# Patient Record
Sex: Male | Born: 1935 | Race: White | Hispanic: No | Marital: Married | Smoking: Never smoker
Health system: Southern US, Community
[De-identification: ages and names within clinical notes are randomized; demographics above are authoritative.]

## PROBLEM LIST (undated history)

## (undated) DIAGNOSIS — E7800 Pure hypercholesterolemia, unspecified: Secondary | ICD-10-CM

## (undated) DIAGNOSIS — F0390 Unspecified dementia without behavioral disturbance: Secondary | ICD-10-CM

## (undated) DIAGNOSIS — I2510 Atherosclerotic heart disease of native coronary artery without angina pectoris: Secondary | ICD-10-CM

## (undated) DIAGNOSIS — E119 Type 2 diabetes mellitus without complications: Secondary | ICD-10-CM

---

## 2016-08-11 ENCOUNTER — Emergency Department (HOSPITAL_BASED_OUTPATIENT_CLINIC_OR_DEPARTMENT_OTHER)
Admission: EM | Admit: 2016-08-11 | Discharge: 2016-08-11 | Disposition: A | Discharge status: Home / Self Care | Payer: Medicare Other | Attending: Emergency Medicine | Admitting: Emergency Medicine

## 2016-08-11 ENCOUNTER — Emergency Department (HOSPITAL_BASED_OUTPATIENT_CLINIC_OR_DEPARTMENT_OTHER): Payer: Medicare Other

## 2016-08-11 ENCOUNTER — Encounter (HOSPITAL_BASED_OUTPATIENT_CLINIC_OR_DEPARTMENT_OTHER): Payer: Self-pay | Admitting: Emergency Medicine

## 2016-08-11 DIAGNOSIS — Y9384 Activity, sleeping: Secondary | ICD-10-CM | POA: Insufficient documentation

## 2016-08-11 DIAGNOSIS — E119 Type 2 diabetes mellitus without complications: Secondary | ICD-10-CM | POA: Insufficient documentation

## 2016-08-11 DIAGNOSIS — I2510 Atherosclerotic heart disease of native coronary artery without angina pectoris: Secondary | ICD-10-CM | POA: Insufficient documentation

## 2016-08-11 DIAGNOSIS — S0990XA Unspecified injury of head, initial encounter: Secondary | ICD-10-CM | POA: Diagnosis not present

## 2016-08-11 DIAGNOSIS — M79642 Pain in left hand: Secondary | ICD-10-CM | POA: Insufficient documentation

## 2016-08-11 DIAGNOSIS — W19XXXA Unspecified fall, initial encounter: Secondary | ICD-10-CM

## 2016-08-11 DIAGNOSIS — S6991XA Unspecified injury of right wrist, hand and finger(s), initial encounter: Secondary | ICD-10-CM | POA: Diagnosis present

## 2016-08-11 DIAGNOSIS — R1084 Generalized abdominal pain: Secondary | ICD-10-CM | POA: Insufficient documentation

## 2016-08-11 DIAGNOSIS — W06XXXA Fall from bed, initial encounter: Secondary | ICD-10-CM | POA: Insufficient documentation

## 2016-08-11 DIAGNOSIS — Y999 Unspecified external cause status: Secondary | ICD-10-CM | POA: Insufficient documentation

## 2016-08-11 DIAGNOSIS — Y92122 Bedroom in nursing home as the place of occurrence of the external cause: Secondary | ICD-10-CM | POA: Diagnosis not present

## 2016-08-11 DIAGNOSIS — S161XXA Strain of muscle, fascia and tendon at neck level, initial encounter: Secondary | ICD-10-CM | POA: Diagnosis not present

## 2016-08-11 DIAGNOSIS — R109 Unspecified abdominal pain: Secondary | ICD-10-CM | POA: Diagnosis not present

## 2016-08-11 HISTORY — DX: Type 2 diabetes mellitus without complications: E11.9

## 2016-08-11 HISTORY — DX: Atherosclerotic heart disease of native coronary artery without angina pectoris: I25.10

## 2016-08-11 HISTORY — DX: Pure hypercholesterolemia, unspecified: E78.00

## 2016-08-11 HISTORY — DX: Unspecified dementia, unspecified severity, without behavioral disturbance, psychotic disturbance, mood disturbance, and anxiety: F03.90

## 2016-08-11 LAB — COMPREHENSIVE METABOLIC PANEL
ALT: 11 U/L — AB (ref 17–63)
AST: 19 U/L (ref 15–41)
Albumin: 3.4 g/dL — ABNORMAL LOW (ref 3.5–5.0)
Alkaline Phosphatase: 70 U/L (ref 38–126)
Anion gap: 7 (ref 5–15)
BUN: 22 mg/dL — ABNORMAL HIGH (ref 6–20)
CHLORIDE: 105 mmol/L (ref 101–111)
CO2: 26 mmol/L (ref 22–32)
CREATININE: 1.03 mg/dL (ref 0.61–1.24)
Calcium: 8.7 mg/dL — ABNORMAL LOW (ref 8.9–10.3)
Glucose, Bld: 108 mg/dL — ABNORMAL HIGH (ref 65–99)
POTASSIUM: 4 mmol/L (ref 3.5–5.1)
Sodium: 138 mmol/L (ref 135–145)
TOTAL PROTEIN: 6.2 g/dL — AB (ref 6.5–8.1)
Total Bilirubin: 0.6 mg/dL (ref 0.3–1.2)

## 2016-08-11 LAB — CBC WITH DIFFERENTIAL/PLATELET
BASOS ABS: 0 10*3/uL (ref 0.0–0.1)
BASOS PCT: 0 %
Eosinophils Absolute: 0.2 10*3/uL (ref 0.0–0.7)
Eosinophils Relative: 4 %
HCT: 33.8 % — ABNORMAL LOW (ref 39.0–52.0)
Hemoglobin: 11.1 g/dL — ABNORMAL LOW (ref 13.0–17.0)
LYMPHS PCT: 27 %
Lymphs Abs: 0.9 10*3/uL (ref 0.7–4.0)
MCH: 32.4 pg (ref 26.0–34.0)
MCHC: 32.8 g/dL (ref 30.0–36.0)
MCV: 98.5 fL (ref 78.0–100.0)
Monocytes Absolute: 0.5 10*3/uL (ref 0.1–1.0)
Monocytes Relative: 14 %
Neutro Abs: 1.9 10*3/uL (ref 1.7–7.7)
Neutrophils Relative %: 55 %
PLATELETS: 215 10*3/uL (ref 150–400)
RBC: 3.43 MIL/uL — AB (ref 4.22–5.81)
RDW: 13.5 % (ref 11.5–15.5)
WBC: 3.4 10*3/uL — AB (ref 4.0–10.5)

## 2016-08-11 LAB — LIPASE, BLOOD: LIPASE: 41 U/L (ref 11–51)

## 2016-08-11 LAB — URINALYSIS, ROUTINE W REFLEX MICROSCOPIC
Glucose, UA: 100 mg/dL — AB
Hgb urine dipstick: NEGATIVE
Ketones, ur: 15 mg/dL — AB
LEUKOCYTES UA: NEGATIVE
NITRITE: NEGATIVE
PROTEIN: NEGATIVE mg/dL
Specific Gravity, Urine: 1.025 (ref 1.005–1.030)
pH: 6 (ref 5.0–8.0)

## 2016-08-11 LAB — TROPONIN I

## 2016-08-11 MED ORDER — SODIUM CHLORIDE 0.9 % IV BOLUS (SEPSIS)
1000.0000 mL | Freq: Once | INTRAVENOUS | Status: AC
  Administered 2016-08-11: 1000 mL via INTRAVENOUS

## 2016-08-11 NOTE — ED Triage Notes (Signed)
Pt lives at [facility]. Staff notified son that he has fallen twice and was noted to have orthostatic hypotension. Pt c/o L little finger pain from the fall, bandage in place with some bleeding noted. Pt states he doesn't know why he is falling. Pt denies neck or back pain.

## 2016-08-11 NOTE — Discharge Instructions (Signed)
You were seen in the ED today after a fall. There is a small cut to the finger which you should keep wrapped and allow to heal on its own. You were found to have a significant amount of stool on x-ray and may need to take Miralax or other stool softener to promote good bowel movements. Follow up with your PCP this week.   Return to the ED with any new or worsening pain or additional falls.

## 2016-08-11 NOTE — ED Notes (Signed)
MD Long at bedside discussing results with patient and family.

## 2016-08-11 NOTE — ED Notes (Signed)
Pt remains in radiology at this time.

## 2016-08-11 NOTE — ED Notes (Signed)
Pt in radiology 

## 2016-08-11 NOTE — ED Provider Notes (Signed)
Emergency Department Provider Note  By signing my name below, I, Deland Pretty, attest that this documentation has been prepared under the direction and in the presence of Juquan Reznick, Arlyss Repress, MD. Electronically Signed: Deland Pretty, ED Scribe. 08/11/16. 3:58 PM.  I have reviewed the triage vital signs and the nursing notes.   HISTORY  Chief Complaint Fall   HPI Comments: Terry Cunningham is a 82 y.o. male who presents to the Emergency Department complaining of mild right little finger pain s/p a mechanical fall that occurred today. The pt is also complaining of repeated falls that may be due to orthostatic hypotension, per son, and severe abdominal pain. Per son, the pt was found on the floor within [facility] and told the nurses that he fell out of his bed this morning. His son also indicates that the pt fell at home repeatedly and last week at approximately 1:30am this morning. The pt was recently prescribed Plavix, after being off of it for about a year. The pt ambulates with a walker. There is no associated chest pain or SOB.  Level 5 caveat: Dementia.   Past Medical History:  Diagnosis Date  . Coronary artery disease   . Dementia   . Diabetes mellitus without complication (HCC)   . High cholesterol     There are no active problems to display for this patient.   No past surgical history on file.  Current Outpatient Rx  . Order #: [id number] Class: Historical Med  . Order #: [id number] Class: Historical Med  . Order #: [id number] Class: Historical Med  . Order #: [id number] Class: Historical Med  . Order #: [id number] Class: Historical Med    Allergies Patient has no allergy information on record.  No family history on file.  Social History Social History  Substance Use Topics  . Smoking status: Never Smoker  . Smokeless tobacco: Never Used  . Alcohol use No    Review of Systems  Level 5 caveat: Dementia.    ____________________________________________   PHYSICAL EXAM:  VITAL SIGNS: ED Triage Vitals [08/11/16 1451]  Enc Vitals Group     BP 103/62     Pulse Rate 68     Resp      Temp 98.4 F (36.9 C)     Temp Source Oral     SpO2 98 %     Pain Score 0   Constitutional: Alert and oriented. Well appearing and in no acute distress. Eyes: Conjunctivae are normal.  Head: Atraumatic. Nose: No congestion/rhinnorhea. Mouth/Throat: Mucous membranes are moist.  Oropharynx non-erythematous. Neck: No stridor.   Cardiovascular: Normal rate, regular rhythm. Good peripheral circulation. Grossly normal heart sounds.   Respiratory: Normal respiratory effort.  No retractions. Lungs CTAB. Gastrointestinal: Soft with diffuse tenderness. No rebound or guarding. No distention.  Musculoskeletal: No lower extremity tenderness nor edema. No gross deformities of extremities. Full ROM of bilateral hips, knees, and ankles. Normal ROM of the shoulder, elbow, and wrists.  Neurologic:  Normal speech and language. No gross focal neurologic deficits are appreciated.  Skin:  Skin is warm, dry. No rash noted. Positive left little finger laceration 0.25 cm.   ____________________________________________   DIAGNOSTIC STUDIES: Oxygen Saturation is 98% on RA, normal by my interpretation.   COORDINATION OF CARE: 3:22 PM-Discussed next steps with pt. Pt verbalized understanding and is agreeable with the plan.   LABS (all labs ordered are listed, but only abnormal results are displayed)  Labs Reviewed  COMPREHENSIVE METABOLIC PANEL - Abnormal; Notable  for the following:       Result Value   Glucose, Bld 108 (*)    BUN 22 (*)    Calcium 8.7 (*)    Total Protein 6.2 (*)    Albumin 3.4 (*)    ALT 11 (*)    All other components within normal limits  CBC WITH DIFFERENTIAL/PLATELET - Abnormal; Notable for the following:    WBC 3.4 (*)    RBC 3.43 (*)    Hemoglobin 11.1 (*)    HCT 33.8 (*)    All other  components within normal limits  URINALYSIS, ROUTINE W REFLEX MICROSCOPIC - Abnormal; Notable for the following:    Glucose, UA 100 (*)    Bilirubin Urine SMALL (*)    Ketones, ur 15 (*)    All other components within normal limits  LIPASE, BLOOD  TROPONIN I   ____________________________________________  EKG   EKG Interpretation  Date/Time:  Sunday August 11 2016 15:02:24 EDT Ventricular Rate:  63 PR Interval:    QRS Duration: 89 QT Interval:  431 QTC Calculation: 442 R Axis:   46 Text Interpretation:  Sinus rhythm Short PR interval Low voltage, precordial leads Probable anteroseptal infarct, old Minimal ST elevation, inferior leads Baseline wander in lead(s) V6 No STEMI. No old for comparison.  Confirmed by Alona Bene [id number]) on 08/11/2016 3:07:18 PM       ____________________________________________  RADIOLOGY  Ct Head Wo Contrast  Result Date: 08/11/2016 CLINICAL DATA:  Status post fall EXAM: CT HEAD WITHOUT CONTRAST CT CERVICAL SPINE WITHOUT CONTRAST TECHNIQUE: Multidetector CT imaging of the head and cervical spine was performed following the standard protocol without intravenous contrast. Multiplanar CT image reconstructions of the cervical spine were also generated. COMPARISON:  None. FINDINGS: CT HEAD FINDINGS Brain: No evidence of acute infarction, hemorrhage, extra-axial collection or mass lesion/mass effect. Global cortical and central atrophy. Secondary ventricular prominence. Subcortical white matter and periventricular small vessel ischemic changes. Vascular: Mild intracranial atherosclerosis. Skull: Normal. Negative for fracture or focal lesion. Sinuses/Orbits: The visualized paranasal sinuses are essentially clear. The mastoid air cells are unopacified. Other: None. CT CERVICAL SPINE FINDINGS Alignment: Normal cervical lordosis. Skull base and vertebrae: No acute fracture. No primary bone lesion or focal pathologic process. Soft tissues and spinal canal: No  prevertebral fluid or swelling. No visible canal hematoma. Disc levels:  Mild degenerative changes of the lower cervical spine. Spinal canal is patent. Upper chest: Visualized lung apices are notable for mild biapical pleural-parenchymal scarring. Other: Visualized thyroid is unremarkable. IMPRESSION: No evidence of acute intracranial abnormality. Atrophy with secondary ventricular prominence. Small vessel ischemic changes. No evidence of traumatic injury to the cervical spine. Mild degenerative changes. Electronically Signed   By: Charline Bills M.D.   On: 08/11/2016 16:25   Ct Cervical Spine Wo Contrast  Result Date: 08/11/2016 CLINICAL DATA:  Status post fall EXAM: CT HEAD WITHOUT CONTRAST CT CERVICAL SPINE WITHOUT CONTRAST TECHNIQUE: Multidetector CT imaging of the head and cervical spine was performed following the standard protocol without intravenous contrast. Multiplanar CT image reconstructions of the cervical spine were also generated. COMPARISON:  None. FINDINGS: CT HEAD FINDINGS Brain: No evidence of acute infarction, hemorrhage, extra-axial collection or mass lesion/mass effect. Global cortical and central atrophy. Secondary ventricular prominence. Subcortical white matter and periventricular small vessel ischemic changes. Vascular: Mild intracranial atherosclerosis. Skull: Normal. Negative for fracture or focal lesion. Sinuses/Orbits: The visualized paranasal sinuses are essentially clear. The mastoid air cells are unopacified. Other: None. CT CERVICAL SPINE FINDINGS  Alignment: Normal cervical lordosis. Skull base and vertebrae: No acute fracture. No primary bone lesion or focal pathologic process. Soft tissues and spinal canal: No prevertebral fluid or swelling. No visible canal hematoma. Disc levels:  Mild degenerative changes of the lower cervical spine. Spinal canal is patent. Upper chest: Visualized lung apices are notable for mild biapical pleural-parenchymal scarring. Other: Visualized  thyroid is unremarkable. IMPRESSION: No evidence of acute intracranial abnormality. Atrophy with secondary ventricular prominence. Small vessel ischemic changes. No evidence of traumatic injury to the cervical spine. Mild degenerative changes. Electronically Signed   By: Charline BillsSriyesh  Krishnan M.D.   On: 08/11/2016 16:25   Dg Abdomen Acute W/chest  Result Date: 08/11/2016 CLINICAL DATA:  Severe abdominal pain. Multiple recent falls, possibly due to orthostatic hypotension. EXAM: DG ABDOMEN ACUTE W/ 1V CHEST COMPARISON:  None. FINDINGS: Normal sized heart. Tortuous aorta. Clear lungs with normal vascularity. Old, healed right rib fractures. No acute fracture or pneumothorax. Surgical clips at the gastroesophageal junction. Normal bowel gas pattern without free peritoneal air. Large amount of stool in the colon. Extensive thoracic spine degenerative changes with L1 and L2 compression deformities. IMPRESSION: 1. L1 and L2 vertebral compression deformities, age indeterminate. 2. Large amount of stool throughout the colon. 3. No acute abnormality. Electronically Signed   By: Beckie SaltsSteven  Reid M.D.   On: 08/11/2016 16:28   Dg Hand Complete Left  Result Date: 08/11/2016 CLINICAL DATA:  Mild left little finger pain following a fall today. EXAM: LEFT HAND - COMPLETE 3+ VIEW COMPARISON:  None. FINDINGS: No fracture or dislocation seen. Extensive first metacarpal/ carpal degenerative changes. Mild degenerative changes involving multiple interphalangeal joints. IMPRESSION: No fracture.  Degenerative changes. Electronically Signed   By: Beckie SaltsSteven  Reid M.D.   On: 08/11/2016 16:29    ____________________________________________   PROCEDURES  Procedure(s) performed:   Procedures  None ____________________________________________   INITIAL IMPRESSION / ASSESSMENT AND PLAN / ED COURSE  Pertinent labs & imaging results that were available during my care of the patient were reviewed by me and considered in my medical  decision making (see chart for details).  Patient presents to the emergency department for evaluation of 2 falls at his assisted living facility. One this morning. History is limited by dementia. Fall was unwitnessed. No evidence of head trauma. The patient does have a small laceration to the left fifth finger. Patient with full range of motion of the finger. No nail involvement. The wound is well approximated and hemostatic.   Labs, orthostatics, imaging all negative. Patient is ambulatory here in the ED. Discussed PCP follow up for increased PT at ALF vs transfer to memory care area.  At this time, I do not feel there is any life-threatening condition present. I have reviewed and discussed all results (EKG, imaging, lab, urine as appropriate), exam findings with patient. I have reviewed nursing notes and appropriate previous records.  I feel the patient is safe to be discharged home without further emergent workup. Discussed usual and customary return precautions. Patient and family (if present) verbalize understanding and are comfortable with this plan.  Patient will follow-up with their primary care provider. If they do not have a primary care provider, information for follow-up has been provided to them. All questions have been answered.  ____________________________________________  FINAL CLINICAL IMPRESSION(S) / ED DIAGNOSES  Final diagnoses:  Fall, initial encounter  Injury of head, initial encounter  Strain of neck muscle, initial encounter  Left hand pain  Generalized abdominal pain     MEDICATIONS  GIVEN DURING THIS VISIT:  Medications  sodium chloride 0.9 % bolus 1,000 mL (0 mLs Intravenous Stopped 08/11/16 1620)     NEW OUTPATIENT MEDICATIONS STARTED DURING THIS VISIT:  None   I personally performed the services described in this documentation, which was scribed in my presence. The recorded information has been reviewed and is accurate.    Note:  This document was  prepared using Dragon voice recognition software and may include unintentional dictation errors.  Alona Bene, MD Emergency Medicine    Shannan Garfinkel, Arlyss Repress, MD 08/12/16 [phone]

## 2016-08-11 NOTE — ED Notes (Signed)
Pt back in room. Given urinal to attempt to provide sample.

## 2016-09-29 ENCOUNTER — Emergency Department (HOSPITAL_BASED_OUTPATIENT_CLINIC_OR_DEPARTMENT_OTHER): Payer: Medicare Other

## 2016-09-29 ENCOUNTER — Emergency Department (HOSPITAL_BASED_OUTPATIENT_CLINIC_OR_DEPARTMENT_OTHER)
Admission: EM | Admit: 2016-09-29 | Discharge: 2016-09-30 | Disposition: A | Discharge status: Home / Self Care | Payer: Medicare Other | Attending: Emergency Medicine | Admitting: Emergency Medicine

## 2016-09-29 ENCOUNTER — Encounter (HOSPITAL_BASED_OUTPATIENT_CLINIC_OR_DEPARTMENT_OTHER): Payer: Self-pay | Admitting: Emergency Medicine

## 2016-09-29 DIAGNOSIS — R1084 Generalized abdominal pain: Secondary | ICD-10-CM | POA: Diagnosis present

## 2016-09-29 DIAGNOSIS — Z79899 Other long term (current) drug therapy: Secondary | ICD-10-CM | POA: Insufficient documentation

## 2016-09-29 DIAGNOSIS — Z7902 Long term (current) use of antithrombotics/antiplatelets: Secondary | ICD-10-CM | POA: Diagnosis not present

## 2016-09-29 DIAGNOSIS — E119 Type 2 diabetes mellitus without complications: Secondary | ICD-10-CM | POA: Insufficient documentation

## 2016-09-29 DIAGNOSIS — F0390 Unspecified dementia without behavioral disturbance: Secondary | ICD-10-CM | POA: Insufficient documentation

## 2016-09-29 DIAGNOSIS — I2510 Atherosclerotic heart disease of native coronary artery without angina pectoris: Secondary | ICD-10-CM | POA: Insufficient documentation

## 2016-09-29 DIAGNOSIS — Z7982 Long term (current) use of aspirin: Secondary | ICD-10-CM | POA: Diagnosis not present

## 2016-09-29 DIAGNOSIS — J189 Pneumonia, unspecified organism: Secondary | ICD-10-CM

## 2016-09-29 DIAGNOSIS — J181 Lobar pneumonia, unspecified organism: Secondary | ICD-10-CM | POA: Insufficient documentation

## 2016-09-29 LAB — CBC WITH DIFFERENTIAL/PLATELET
Basophils Absolute: 0 10*3/uL (ref 0.0–0.1)
Basophils Relative: 0 %
Eosinophils Absolute: 0.2 10*3/uL (ref 0.0–0.7)
Eosinophils Relative: 2 %
HEMATOCRIT: 32.4 % — AB (ref 39.0–52.0)
HEMOGLOBIN: 10.8 g/dL — AB (ref 13.0–17.0)
LYMPHS ABS: 1 10*3/uL (ref 0.7–4.0)
Lymphocytes Relative: 12 %
MCH: 33 pg (ref 26.0–34.0)
MCHC: 33.3 g/dL (ref 30.0–36.0)
MCV: 99.1 fL (ref 78.0–100.0)
MONOS PCT: 16 %
Monocytes Absolute: 1.3 10*3/uL — ABNORMAL HIGH (ref 0.1–1.0)
NEUTROS ABS: 5.8 10*3/uL (ref 1.7–7.7)
NEUTROS PCT: 70 %
Platelets: 237 10*3/uL (ref 150–400)
RBC: 3.27 MIL/uL — ABNORMAL LOW (ref 4.22–5.81)
RDW: 13.1 % (ref 11.5–15.5)
WBC: 8.3 10*3/uL (ref 4.0–10.5)

## 2016-09-29 MED ORDER — ACETAMINOPHEN 325 MG PO TABS: 650.0000 mg | ORAL_TABLET | Freq: Once | ORAL | Status: DC

## 2016-09-29 NOTE — ED Provider Notes (Signed)
[facility] Provider Note   CSN: [id number] Arrival date & time: 09/29/16  2118  By signing my name below, I, Ny'Kea Lewis, attest that this documentation has been prepared under the direction and in the presence of Zadie RhineWickline, Soraya Paquette, MD. Electronically Signed: Karren CobbleNy'Kea Lewis, ED Scribe. 09/29/16. 11:45 PM.  History   Chief Complaint Chief Complaint  Patient presents with  . Flank Pain   The history is provided by the patient and a relative. No language interpreter was used.   HPI HPI Comments: Terry Cunningham is a 83 y.o. male who presents to the Emergency Department complaining of sudden onset, persistent, right flank pain that began this evening. He notes associated abdominal pain and a knot to the right flank area. Denies recent traumativ injuries. Per pt's son, he spent majority of the day with the pt at his assisted living facility and he was not complaining of any symptoms. After he left the facility, he received a call stating the pt was having moderate right flank pain. His pain is exacerbated with deep breathing. Denies fever, back pain, nausea, vomiting, diaphoresis, weakness, or cough.   Past Medical History:  Diagnosis Date  . Coronary artery disease   . Dementia   . Diabetes mellitus without complication (HCC)   . High cholesterol    There are no active problems to display for this patient.  History reviewed. No pertinent surgical history.  Home Medications    Prior to Admission medications   Medication Sig Start Date End Date Taking? Authorizing Provider  aspirin 81 MG chewable tablet Chew by mouth daily.    [provider]  atorvastatin (LIPITOR) 10 MG tablet Take 10 mg by mouth daily.    [provider]  clopidogrel (PLAVIX) 75 MG tablet Take 75 mg by mouth daily.    [provider]  ferrous sulfate 325 (65 FE) MG tablet Take 325 mg by mouth daily with breakfast.    [provider]  pantoprazole (PROTONIX) 40 MG tablet  Take 40 mg by mouth daily.    [provider]   Family History No family history on file.  Social History Social History  Substance Use Topics  . Smoking status: Never Smoker  . Smokeless tobacco: Never Used  . Alcohol use No   Allergies   Patient has no known allergies.  Review of Systems Review of Systems  Constitutional: Negative for diaphoresis and fever.  Respiratory: Negative for cough.   Gastrointestinal: Negative for abdominal pain and vomiting.  Genitourinary: Positive for flank pain.  Neurological: Negative for weakness.  All other systems reviewed and are negative.   Physical Exam Updated Vital Signs BP (!) 146/74 (BP Location: Left Arm)   Pulse 73   Temp 98.5 F (36.9 C) (Oral)   Resp 20   SpO2 100%   Physical Exam CONSTITUTIONAL: Elderly and frail  HEAD: Normocephalic/atraumatic EYES: EOMI/PERRL ENMT: Mucous membranes moist NECK: supple no meningeal signs SPINE/BACK:entire spine nontender CV: S1/S2 noted, no murmurs/rubs/gallops noted LUNGS: Lungs are clear to auscultation bilaterally, no apparent distress CHEST: prominent costal margins noted. No bruising. No erythema noted. No rash ABDOMEN: soft, moderate generalized tenderness, no rebound or guarding, bowel sounds noted throughout abdomen GU:no cva tenderness NEURO: Pt is awake/alert/appropriate, moves all extremitiesx4.  No facial droop.   EXTREMITIES: pulses normal/equal, full ROM SKIN: warm, color normal PSYCH: no abnormalities of mood noted, alert and oriented to situation   ED Treatments / Results  DIAGNOSTIC STUDIES: Oxygen Saturation is 100% on RA,  normal by my interpretation.   COORDINATION OF CARE: 11:28 PM-Discussed next steps with pt. Pt verbalized understanding and is agreeable with the plan.   Labs (all labs ordered are listed, but only abnormal results are displayed) Labs Reviewed  BASIC METABOLIC PANEL - Abnormal; Notable for the following:       Result Value    Chloride 100 (*)    Glucose, Bld 101 (*)    Calcium 8.2 (*)    All other components within normal limits  CBC WITH DIFFERENTIAL/PLATELET - Abnormal; Notable for the following:    RBC 3.27 (*)    Hemoglobin 10.8 (*)    HCT 32.4 (*)    Monocytes Absolute 1.3 (*)    All other components within normal limits  HEPATIC FUNCTION PANEL - Abnormal; Notable for the following:    Albumin 3.3 (*)    ALT 16 (*)    All other components within normal limits  LIPASE, BLOOD    EKG  EKG Interpretation None       Radiology Dg Chest 2 View  Result Date: 09/29/2016 CLINICAL DATA:  Fall 2 weeks ago, left chest pain EXAM: CHEST  2 VIEW COMPARISON:  08/11/2016 FINDINGS: Lungs are clear.  No pleural effusion or pneumothorax. The heart is normal in size. Degenerative changes of the visualized thoracolumbar spine. Surgical clips at the GE junction. IMPRESSION: No evidence of acute cardiopulmonary disease. Electronically Signed   By: Charline BillsSriyesh  Krishnan M.D.   On: 09/29/2016 22:49   Ct Abdomen Pelvis W Contrast  Result Date: 09/30/2016 CLINICAL DATA:  Right flank pain and swelling. History of coronary artery disease, diabetes, dementia. EXAM: CT ABDOMEN AND PELVIS WITH CONTRAST TECHNIQUE: Multidetector CT imaging of the abdomen and pelvis was performed using the standard protocol following bolus administration of intravenous contrast. CONTRAST:  100mL ISOVUE-300 IOPAMIDOL (ISOVUE-300) INJECTION 61% COMPARISON:  None. FINDINGS: Lower chest: Small focal area of infiltration in the right lung base could represent atelectasis or early pneumonia. Surgical clips at the EG junction. Hepatobiliary: No focal liver abnormality is seen. No gallstones, gallbladder Hoban thickening, or biliary dilatation. Pancreas: Unremarkable. No pancreatic ductal dilatation or surrounding inflammatory changes. Spleen: Normal in size without focal abnormality. Adrenals/Urinary Tract: No adrenal gland nodules. Kidneys are symmetrical. No  hydronephrosis or hydroureter. Bladder Kuhlmann is not thickened and no filling defects are identified. Stomach/Bowel: Stomach, small bowel, and colon are not abnormally distended. Stool fills the colon. No Zehring thickening is appreciated. Appendix is normal. Vascular/Lymphatic: Aortic atherosclerosis. No enlarged abdominal or pelvic lymph nodes. Reproductive: Prostate is unremarkable. Other: No abdominal Hume hernia or abnormality. No abdominopelvic ascites. Musculoskeletal: Degenerative changes in the lumbar spine. No destructive bone lesions. Old endplate compression deformities at T12 level. Moderate bony encroachment upon the central canal demonstrated at L4-5 and L5-S1 levels. IMPRESSION: 1. Small focal area of infiltration in the right lung base could represent atelectasis or early pneumonia. 2. No acute process demonstrated in the abdomen or pelvis. 3. Severe degenerative changes in the lumbar spine. Old endplate compression deformities at T12 level. Electronically Signed   By: Burman NievesWilliam  Stevens M.D.   On: 09/30/2016 01:19    Procedures Procedures    Medications Ordered in ED Medications  acetaminophen (TYLENOL) tablet 650 mg (0 mg Oral Hold 09/30/16 0000)  levofloxacin (LEVAQUIN) tablet 500 mg (not administered)  iopamidol (ISOVUE-300) 61 % injection 100 mL (100 mLs Intravenous Contrast Given 09/30/16 0039)    Initial Impression / Assessment and Plan / ED Course  I have reviewed  the triage vital signs and the nursing notes.  Pertinent labs & imaging results that were available during my care of the patient were reviewed by me and considered in my medical decision making (see chart for details).     Difficult history, but apparently pt was reporting right lower rib pain and it was thought he had lump on his ribs It appears due to his frailty and he is thin, likely just prominent costal margin No rash/bruising noted No rib  Fracture He did have diffuse ABD tenderness Ct imaging actually  reveal probable right lower lobe PNA This could be causing some of his pleuritic pain  Repeat exam - no distress, no focal ABD tenderness  Will treat as outpatient for pneumonia He is nontoxic, no distress No hypoxia He is not septic appearing He is safe for outpatient management He is well cared for by son and assisted living We discussed return precautions   Final Clinical Impressions(s) / ED Diagnoses   Final diagnoses:  Community acquired pneumonia of right lower lobe of lung (HCC)   New Prescriptions New Prescriptions   LEVOFLOXACIN (LEVAQUIN) 500 MG TABLET    Take 1 tablet (500 mg total) by mouth daily.  I personally performed the services described in this documentation, which was scribed in my presence. The recorded information has been reviewed and is accurate.        Zadie Rhine, MD 09/30/16 [phone]

## 2016-09-29 NOTE — ED Triage Notes (Signed)
PT presents with complaints of right flank pain and swelling to rt flank. PT lives in assisted living and has history of falls but staff at facility reports no fall today. BIB son.

## 2016-09-30 LAB — HEPATIC FUNCTION PANEL
ALT: 16 U/L — AB (ref 17–63)
AST: 23 U/L (ref 15–41)
Albumin: 3.3 g/dL — ABNORMAL LOW (ref 3.5–5.0)
Alkaline Phosphatase: 80 U/L (ref 38–126)
BILIRUBIN DIRECT: 0.1 mg/dL (ref 0.1–0.5)
BILIRUBIN INDIRECT: 0.3 mg/dL (ref 0.3–0.9)
Total Bilirubin: 0.4 mg/dL (ref 0.3–1.2)
Total Protein: 6.6 g/dL (ref 6.5–8.1)

## 2016-09-30 LAB — BASIC METABOLIC PANEL
Anion gap: 8 (ref 5–15)
BUN: 12 mg/dL (ref 6–20)
CALCIUM: 8.2 mg/dL — AB (ref 8.9–10.3)
CO2: 28 mmol/L (ref 22–32)
CREATININE: 1 mg/dL (ref 0.61–1.24)
Chloride: 100 mmol/L — ABNORMAL LOW (ref 101–111)
GFR calc Af Amer: >60 mL/min (ref >60)
GLUCOSE: 101 mg/dL — AB (ref 65–99)
POTASSIUM: 4 mmol/L (ref 3.5–5.1)
Sodium: 136 mmol/L (ref 135–145)

## 2016-09-30 LAB — LIPASE, BLOOD: Lipase: 30 U/L (ref 11–51)

## 2016-09-30 MED ORDER — LEVOFLOXACIN 500 MG PO TABS
500.0000 mg | ORAL_TABLET | Freq: Once | ORAL | Status: AC
  Administered 2016-09-30: 500 mg via ORAL
  Filled 2016-09-30: qty 1

## 2016-09-30 MED ORDER — LEVOFLOXACIN 500 MG PO TABS: 500.0000 mg | ORAL_TABLET | Freq: Every day | ORAL | 0 refills | Status: AC

## 2016-09-30 MED ORDER — IOPAMIDOL (ISOVUE-300) INJECTION 61%
100.0000 mL | Freq: Once | INTRAVENOUS | Status: AC | PRN
  Administered 2016-09-30: 100 mL via INTRAVENOUS

## 2016-09-30 NOTE — ED Notes (Signed)
Patient returned from CT

## 2016-12-02 DEATH — deceased

## 2019-04-29 IMAGING — CT CT CERVICAL SPINE W/O CM
3 of 5 series · 11 of 33 positions shown, 12 images · non-contrast
Comparison: None.

CLINICAL DATA: Status post fall

EXAM:
CT HEAD WITHOUT CONTRAST
CT CERVICAL SPINE WITHOUT CONTRAST
TECHNIQUE: Multidetector CT imaging of the head and cervical spine was
performed following the standard protocol without intravenous
contrast. Multiplanar CT image reconstructions of the cervical spine
were also generated.

[Series 4: head 3.0 mpr cor · coronal · 0.35mm/px · 3 of 68 slices shown]
[im 14/68  bone]
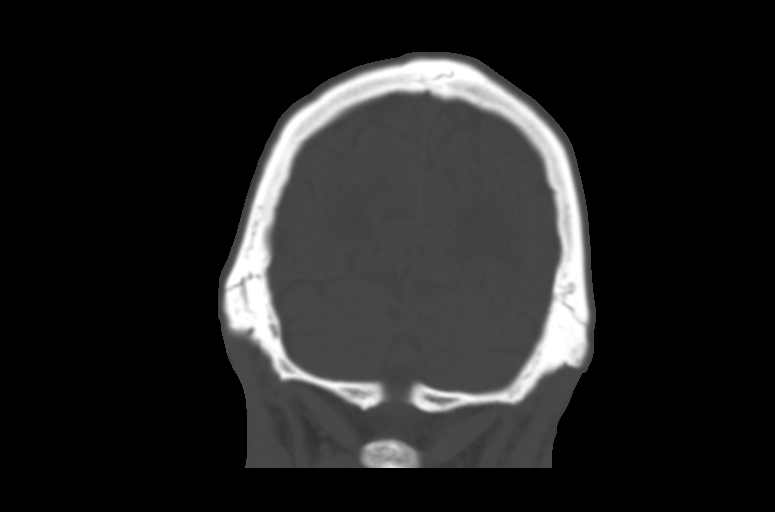
[im 27/68  bone]
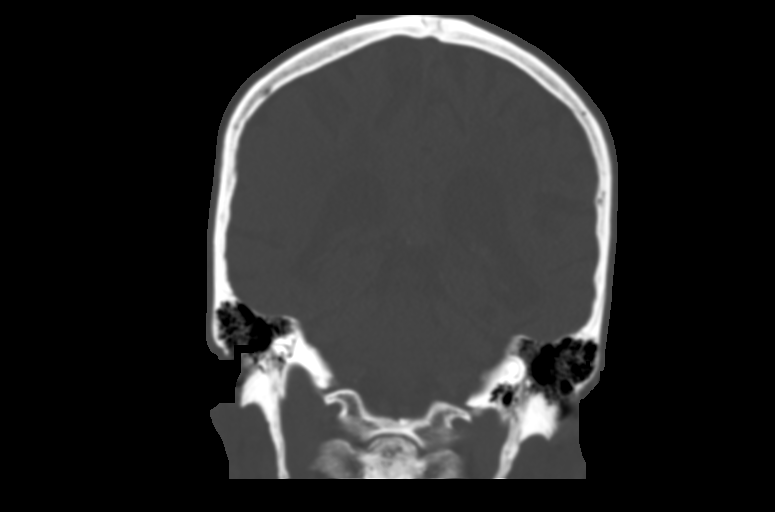
[im 41/68  bone]
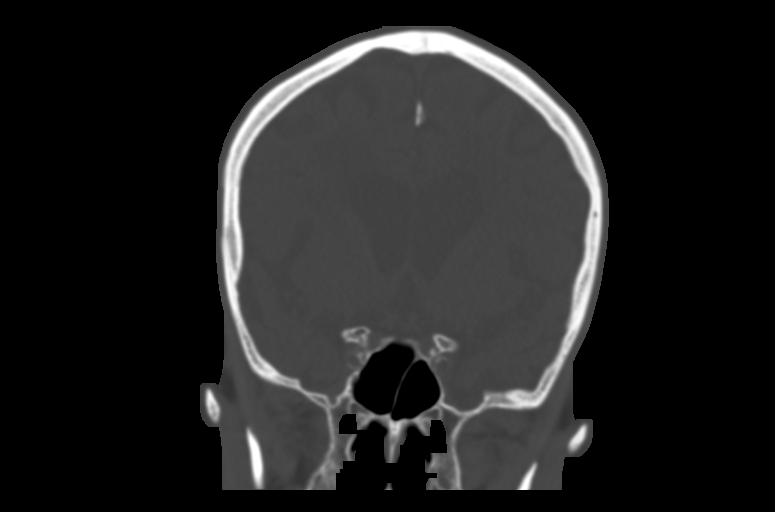

[Series 7: c_spine 2.0 i30s 3 · axial · 0.46mm/px · z∈[-287,-159]mm · 3 of 108 slices shown, 4 images]
[im 22/108  soft-tissue]
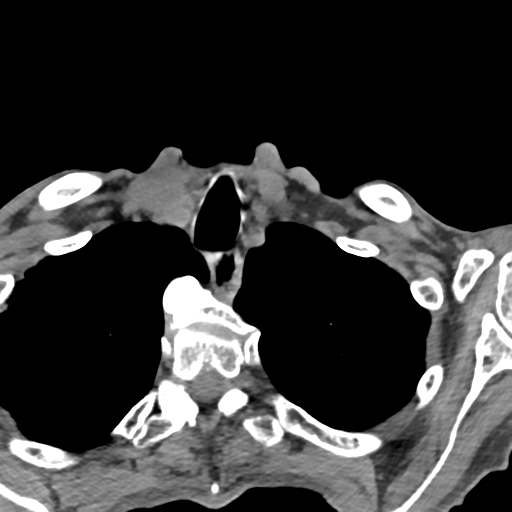
[im 22/108  bone]
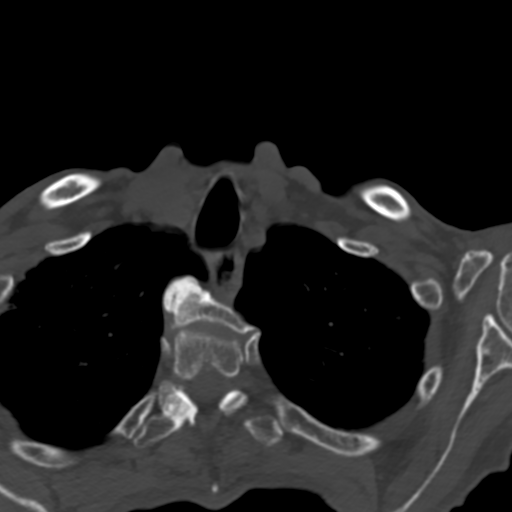
[im 54/108  bone]
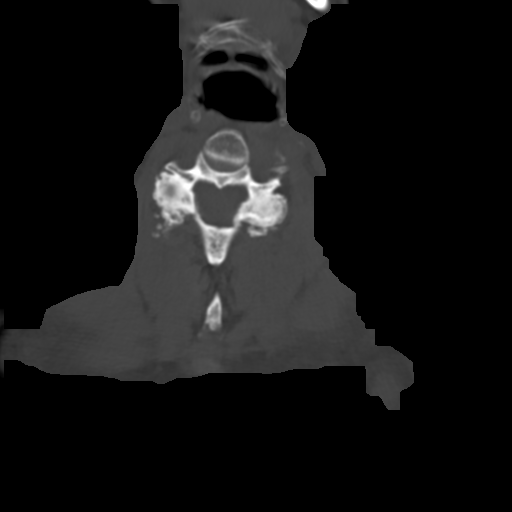
[im 86/108  bone]
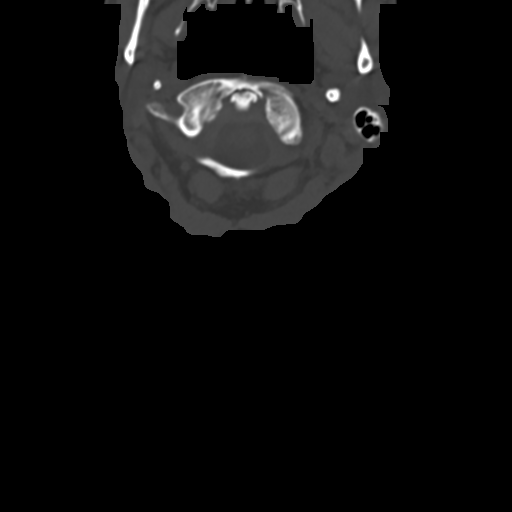

[Series 10: sagittals · sagittal · 0.32mm/px · 5 of 63 slices shown]
[im 11/63  bone]
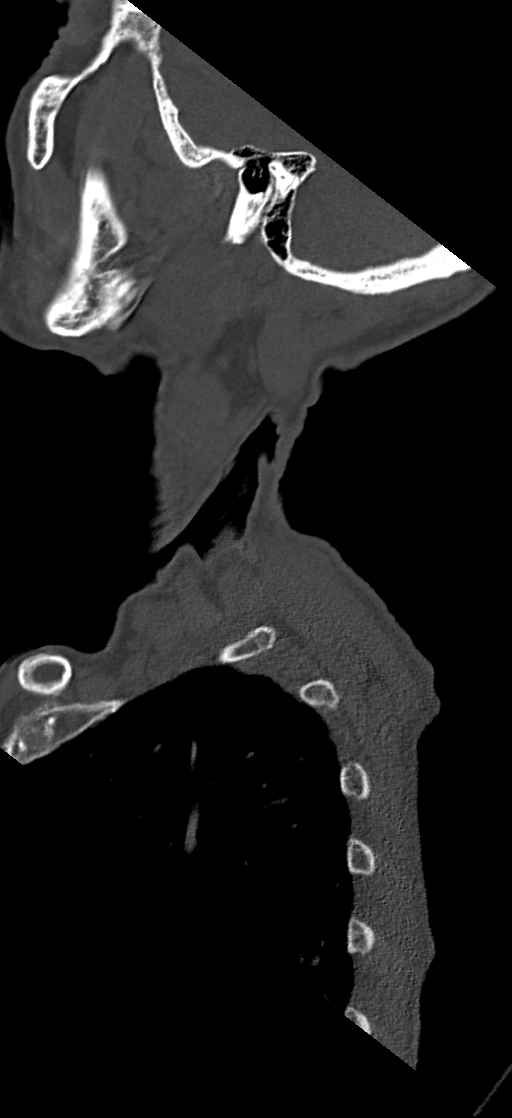
[im 21/63  bone]
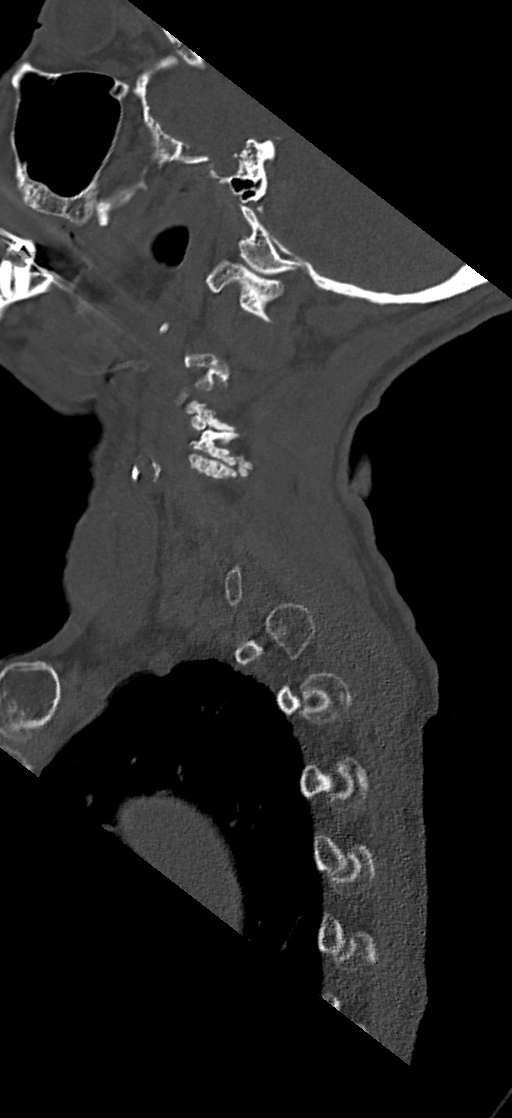
[im 32/63  bone]
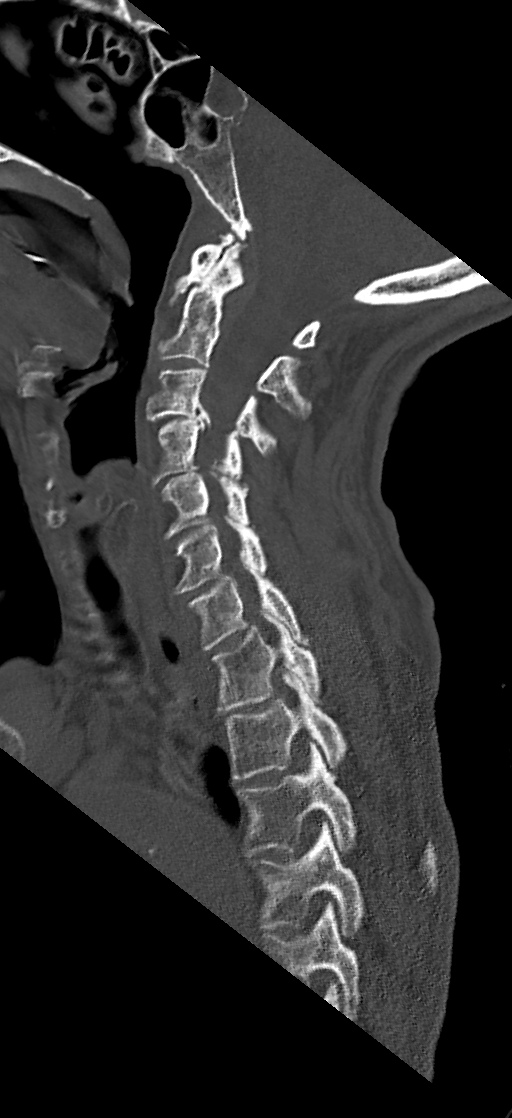
[im 42/63  bone]
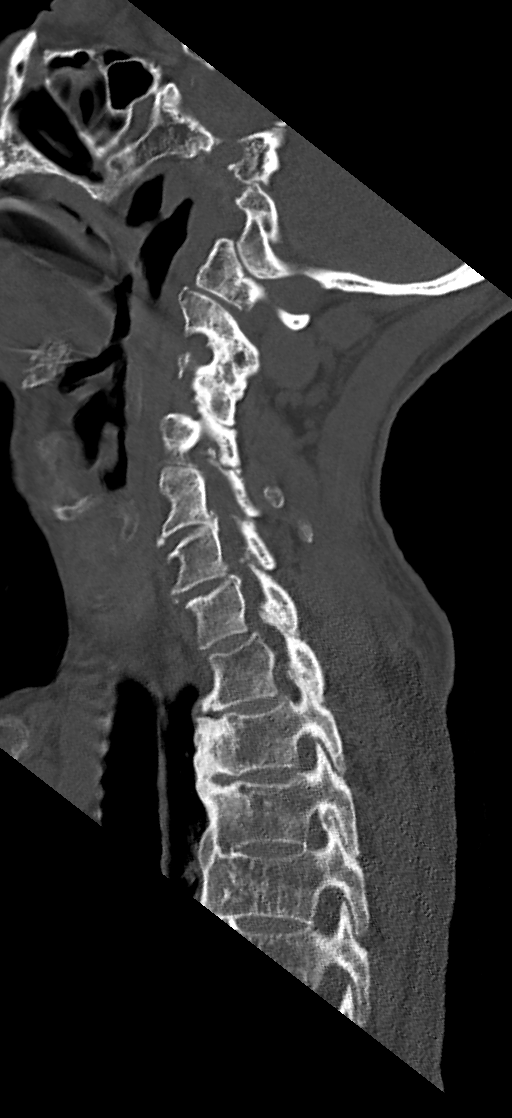
[im 52/63  bone]
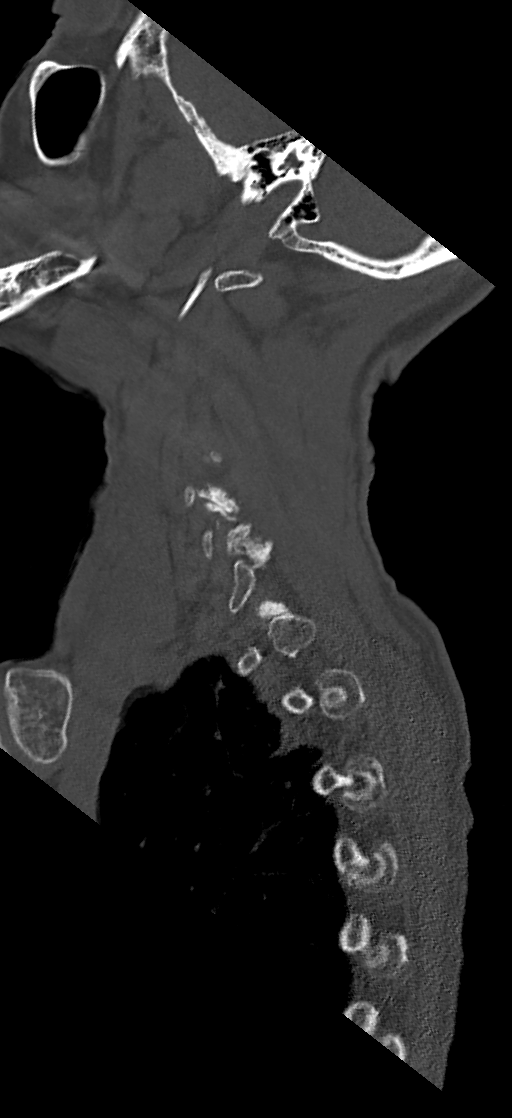

[11 of 33 positions shown; findings below may reference images not displayed]

FINDINGS: CT HEAD FINDINGS

Brain: No evidence of acute infarction, hemorrhage, extra-axial
collection or mass lesion/mass effect.

Global cortical and central atrophy. Secondary ventricular
prominence.

Subcortical white matter and periventricular small vessel ischemic
changes.

Vascular: Mild intracranial atherosclerosis.

Skull: Normal. Negative for fracture or focal lesion.

Sinuses/Orbits: The visualized paranasal sinuses are essentially
clear. The mastoid air cells are unopacified.

Other: None.

CT CERVICAL SPINE FINDINGS

Alignment: Normal cervical lordosis.

Skull base and vertebrae: No acute fracture. No primary bone lesion
or focal pathologic process.

Soft tissues and spinal canal: No prevertebral fluid or swelling. No
visible canal hematoma.

Disc levels:  Mild degenerative changes of the lower cervical spine.

Spinal canal is patent.

Upper chest: Visualized lung apices are notable for mild biapical
pleural-parenchymal scarring.

Other: Visualized thyroid is unremarkable.
IMPRESSION: No evidence of acute intracranial abnormality. Atrophy with
secondary ventricular prominence. Small vessel ischemic changes.

No evidence of traumatic injury to the cervical spine. Mild
degenerative changes.

## 2019-06-17 IMAGING — DX DG CHEST 2V
2 series · 3 of 3 positions shown · non-contrast
Comparison: 08/11/2016

CLINICAL DATA: Fall 2 weeks ago, left chest pain

EXAM:
CHEST  2 VIEW

[chest pa]
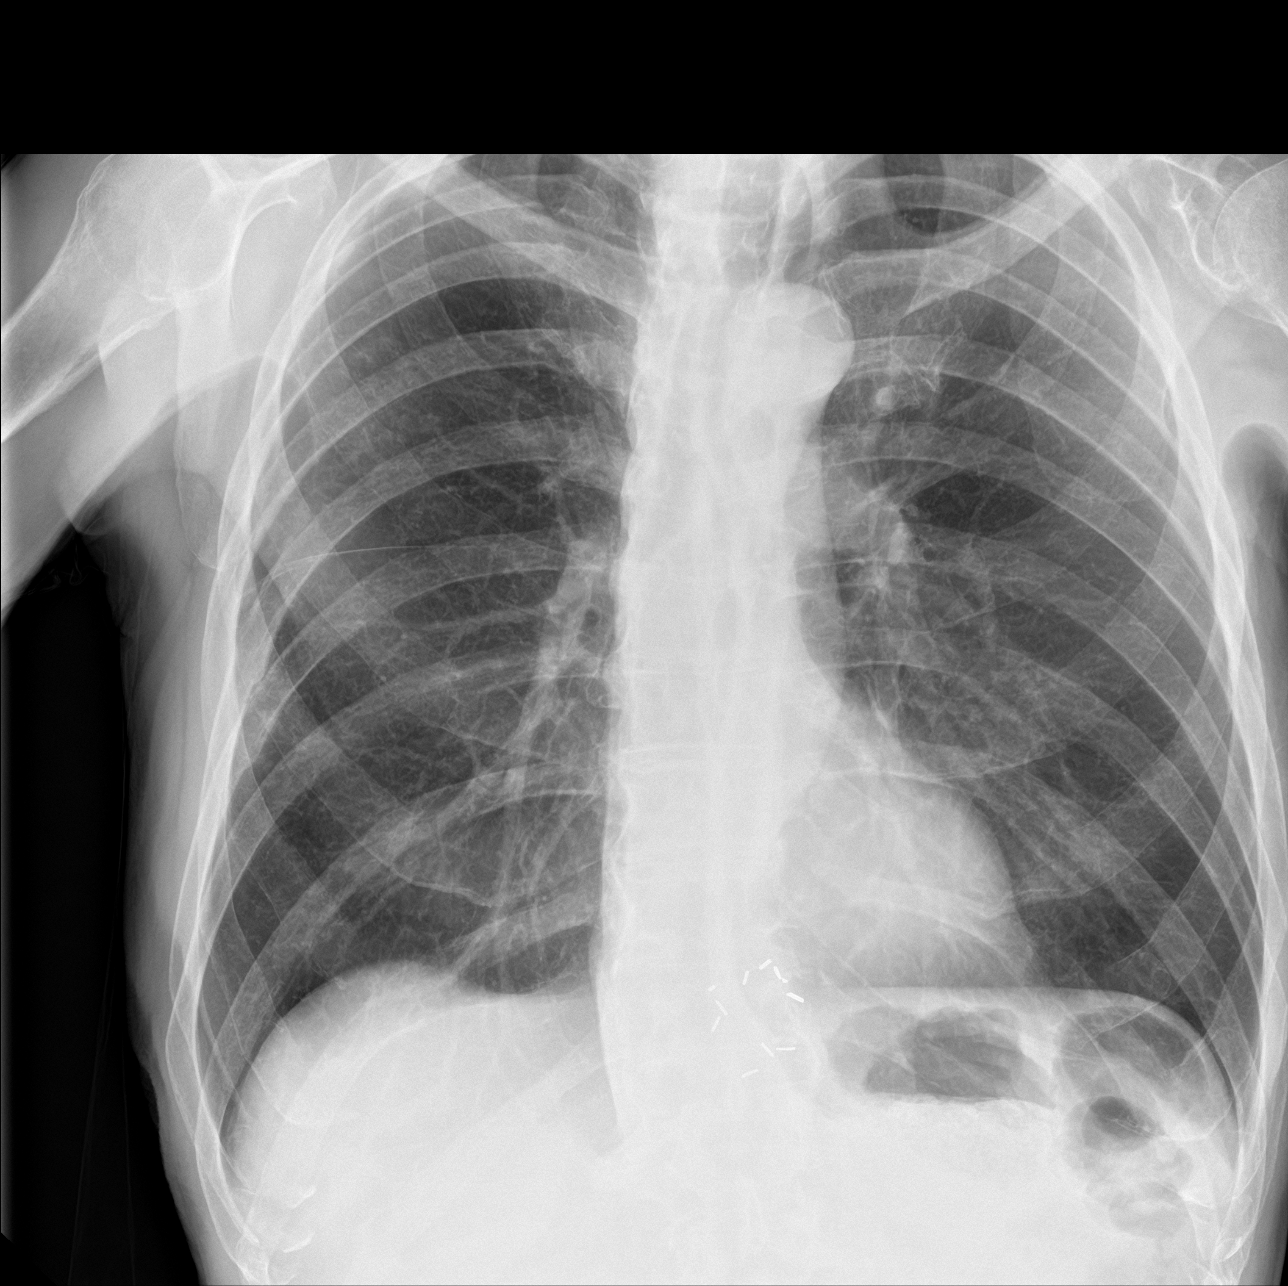

[Series 2: chest lat · 0.14mm/px · 2 of 2 slices shown]
[im 1/2]
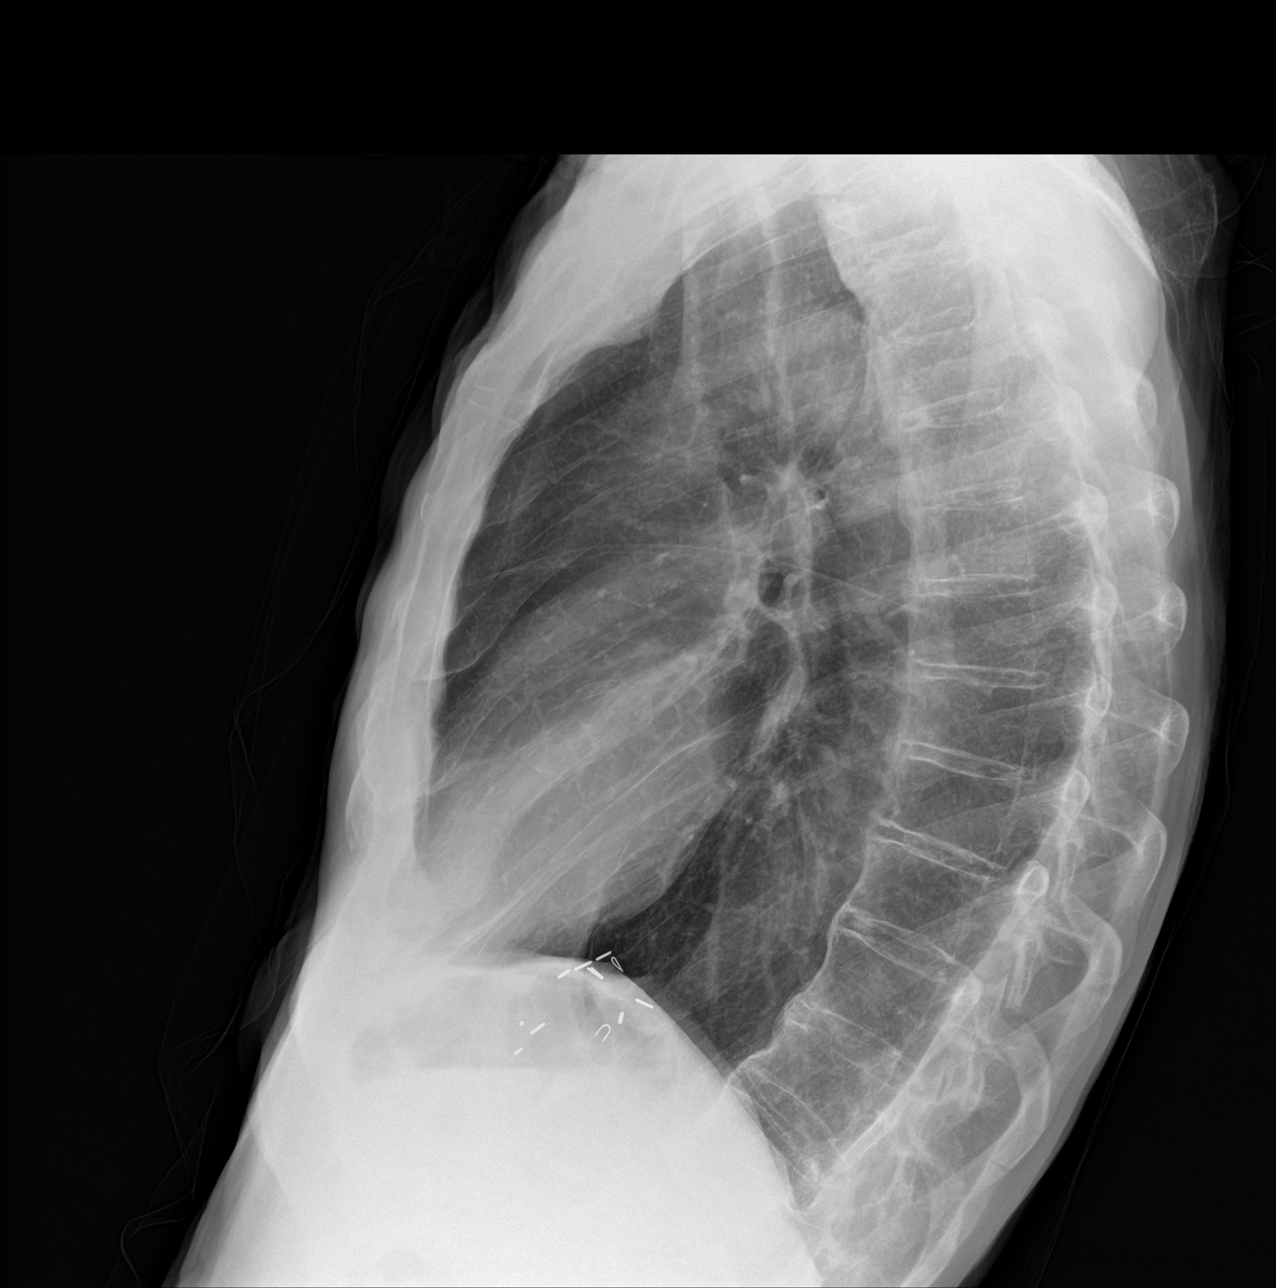
[im 2/2]
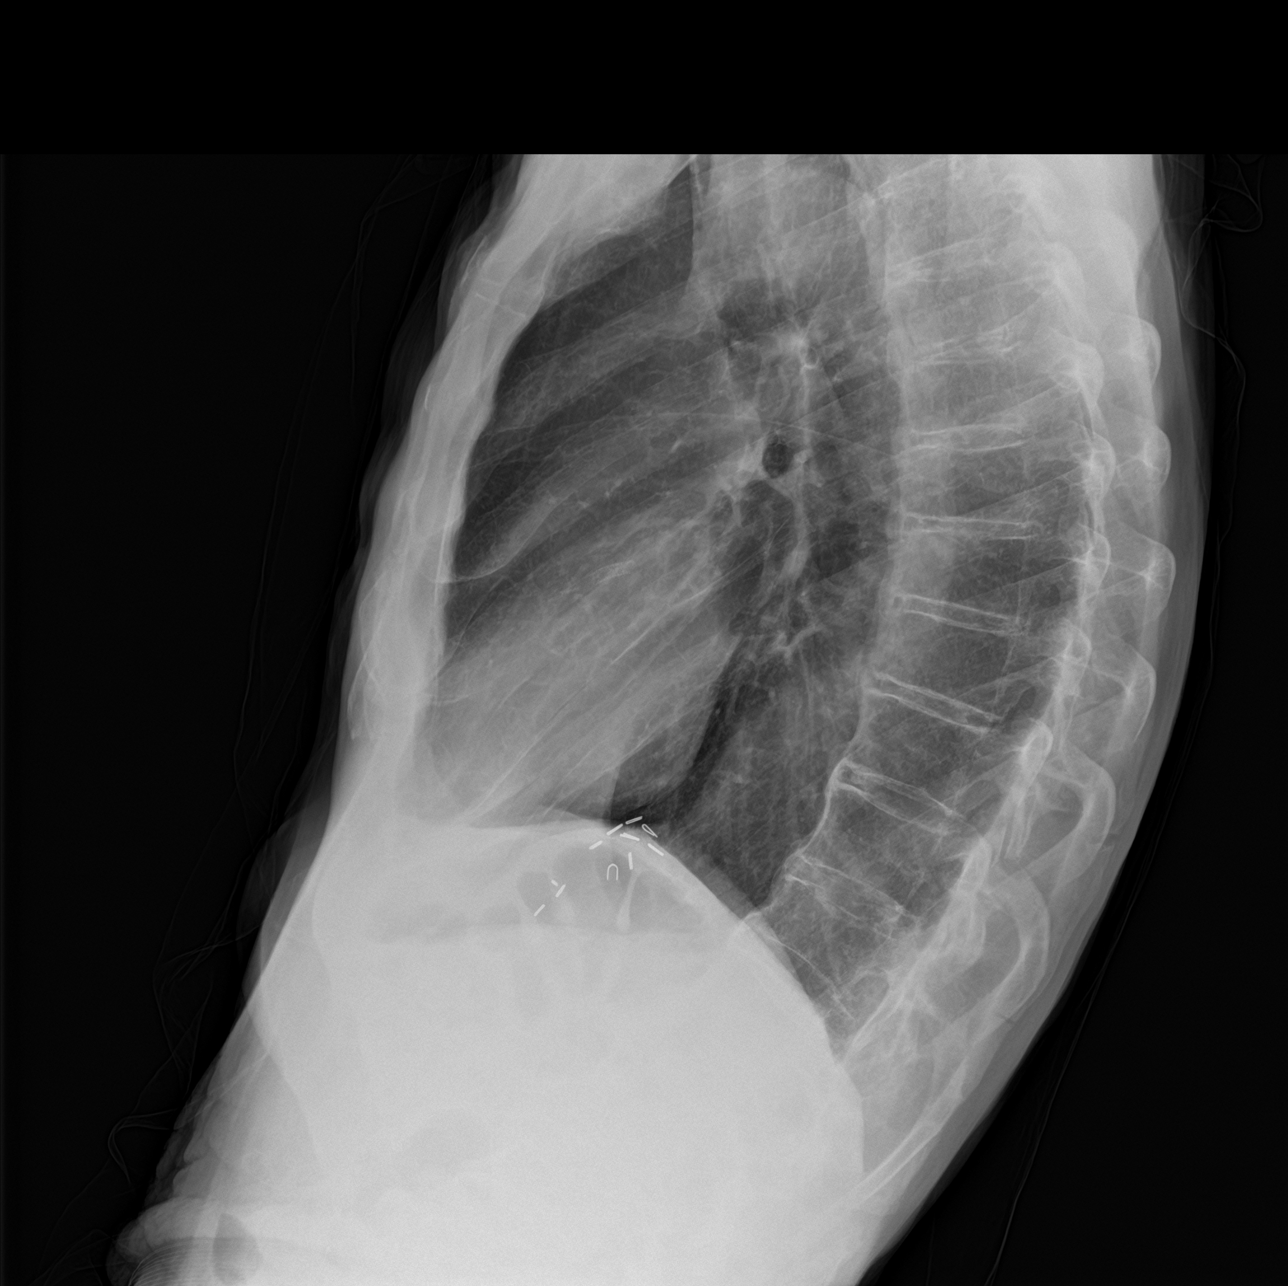

[3 of 3 positions shown; findings below may reference images not displayed]

FINDINGS: Lungs are clear.  No pleural effusion or pneumothorax.

The heart is normal in size.

Degenerative changes of the visualized thoracolumbar spine.

Surgical clips at the GE junction.
IMPRESSION: No evidence of acute cardiopulmonary disease.
